# Patient Record
Sex: Male | Born: 1975 | Race: White | Hispanic: No | Marital: Married | Smoking: Never smoker
Health system: Southern US, Community
[De-identification: ages and names within clinical notes are randomized; demographics above are authoritative.]

## PROBLEM LIST (undated history)

## (undated) HISTORY — PX: COLON SURGERY: SHX602

---

## 2019-12-21 ENCOUNTER — Other Ambulatory Visit: Payer: Self-pay

## 2019-12-21 ENCOUNTER — Encounter: Payer: Self-pay | Admitting: Emergency Medicine

## 2019-12-21 ENCOUNTER — Emergency Department: Payer: BC Managed Care – PPO

## 2019-12-21 ENCOUNTER — Emergency Department
Admission: EM | Admit: 2019-12-21 | Discharge: 2019-12-21 | Disposition: A | Discharge status: Home / Self Care | Payer: BC Managed Care – PPO | Attending: Emergency Medicine | Admitting: Emergency Medicine

## 2019-12-21 DIAGNOSIS — M542 Cervicalgia: Secondary | ICD-10-CM | POA: Diagnosis present

## 2019-12-21 LAB — CBC
HCT: 44.7 % (ref 39.0–52.0)
Hemoglobin: 15.3 g/dL (ref 13.0–17.0)
MCH: 31.7 pg (ref 26.0–34.0)
MCHC: 34.2 g/dL (ref 30.0–36.0)
MCV: 92.5 fL (ref 80.0–100.0)
Platelets: 205 10*3/uL (ref 150–400)
RBC: 4.83 MIL/uL (ref 4.22–5.81)
RDW: 12.5 % (ref 11.5–15.5)
WBC: 7.5 10*3/uL (ref 4.0–10.5)
nRBC: 0 % (ref 0.0–0.2)

## 2019-12-21 LAB — BASIC METABOLIC PANEL
Anion gap: 6 (ref 5–15)
BUN: 16 mg/dL (ref 6–20)
CO2: 28 mmol/L (ref 22–32)
Calcium: 9.3 mg/dL (ref 8.9–10.3)
Chloride: 105 mmol/L (ref 98–111)
Creatinine, Ser: 0.93 mg/dL (ref 0.61–1.24)
GFR calc Af Amer: >60 mL/min (ref >60)
GFR calc non Af Amer: >60 mL/min (ref >60)
Glucose, Bld: 95 mg/dL (ref 70–99)
Potassium: 4.3 mmol/L (ref 3.5–5.1)
Sodium: 139 mmol/L (ref 135–145)

## 2019-12-21 LAB — TSH: TSH: 1.453 u[IU]/mL (ref 0.350–4.500)

## 2019-12-21 LAB — T4, FREE: Free T4: 0.7 ng/dL (ref 0.61–1.12)

## 2019-12-21 LAB — TROPONIN I (HIGH SENSITIVITY): Troponin I (High Sensitivity): 4 ng/L (ref <18)

## 2019-12-21 IMAGING — CT CT ANGIO NECK
1 of 7 series · 7 of 33 positions shown · IV contrast (APPLIED)
Comparison: None.

CLINICAL DATA: Sudden onset left-sided neck pain.

EXAM:
CT ANGIOGRAPHY NECK
TECHNIQUE: Multidetector CT imaging of the neck was performed using the
standard protocol during bolus administration of intravenous
contrast. Multiplanar CT image reconstructions and MIPs were
obtained to evaluate the vascular anatomy. Carotid stenosis
measurements (when applicable) are obtained utilizing NASCET
criteria, using the distal internal carotid diameter as the
denominator.
CONTRAST:  75mL OMNIPAQUE IOHEXOL 350 MG/ML SOLN

[Series 11: ax thin · axial · 0.41mm/px · z∈[-371,-138]mm · 7 of 327 slices shown]
[im 41/327  soft-tissue]
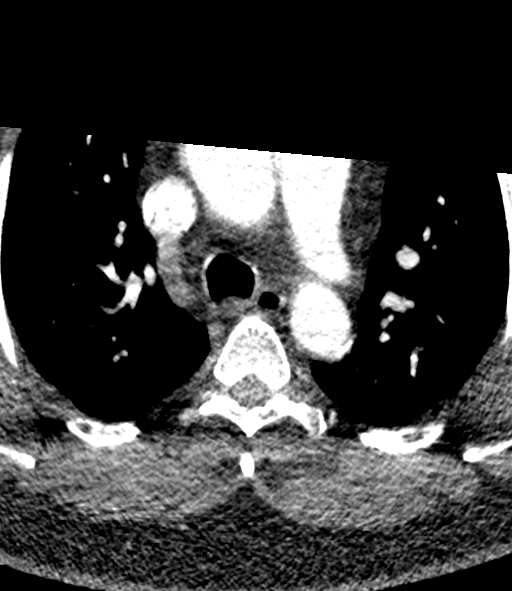
[im 82/327  bone]
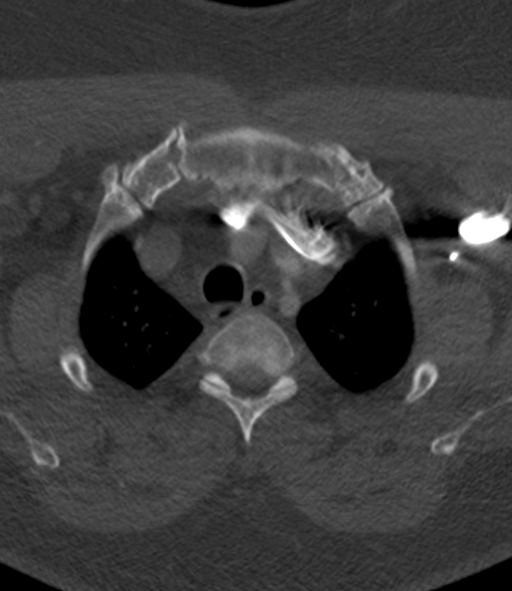
[im 123/327  soft-tissue]
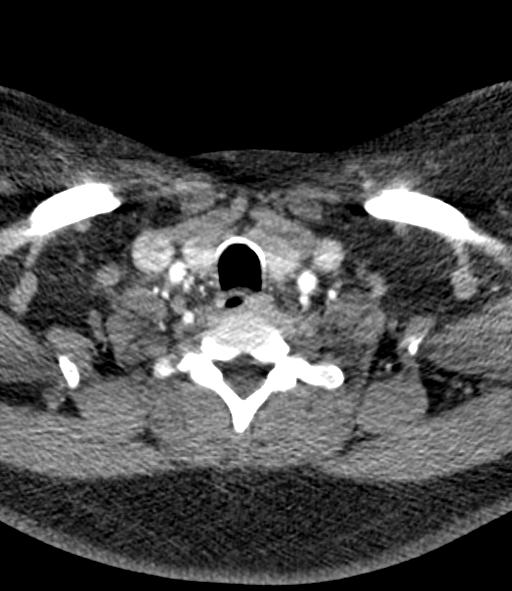
[im 164/327  bone]
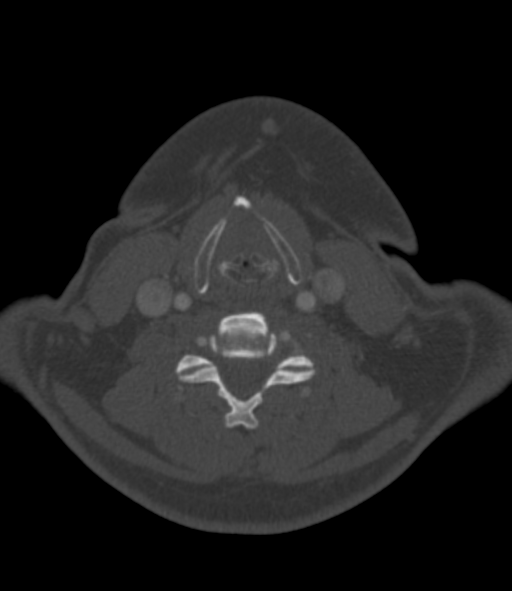
[im 204/327  soft-tissue]
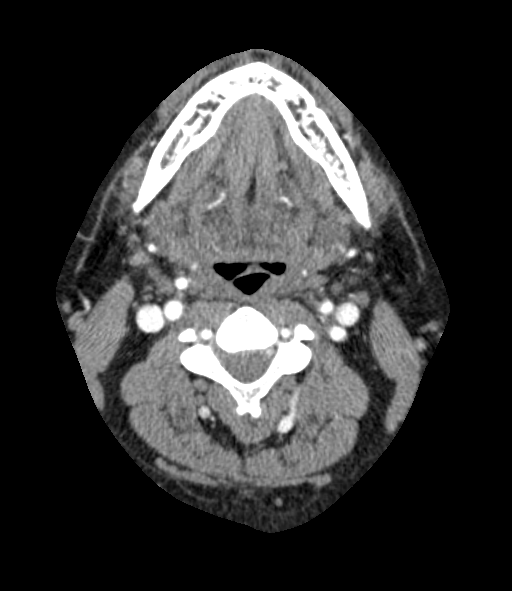
[im 245/327  bone]
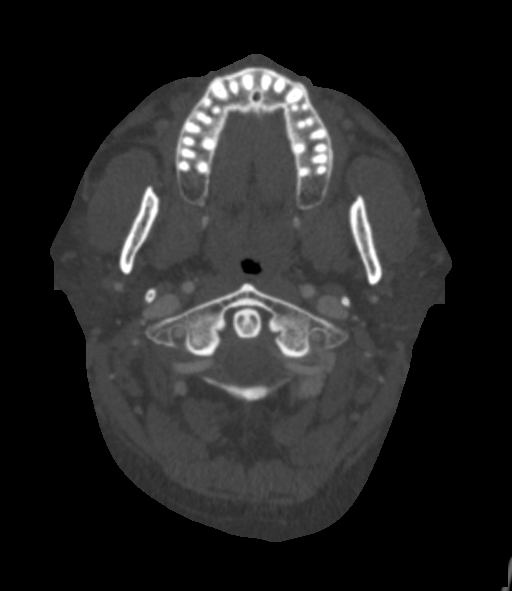
[im 286/327  soft-tissue]
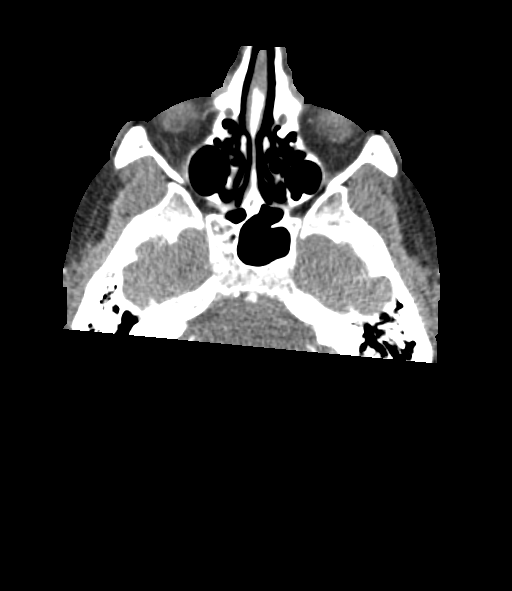

[7 of 33 positions shown; findings below may reference images not displayed]

FINDINGS: Aortic arch: Normal

Right carotid system: Normal except for a punctate focus of
calcified plaque in the ICA bulb. No stenosis. No sign of
dissection.

Left carotid system: Normal. No evidence of atherosclerotic disease
or dissection. No evidence of arteritis.

Vertebral arteries: Normal. Evidence of atherosclerotic disease or
dissection. Vertebral arteries approximately equal in size. Left
arises directly from the arch.

Skeleton: No significant cervical spondylosis or any facet
arthropathy.

Other neck: No evidence other soft tissue lesion in the neck.

Upper chest: Normal
IMPRESSION: Negative examination. No abnormality seen to explain left-sided
pain. No evidence of vascular dissection or arteritis. No sign of
significant spinal disease or other soft tissue lesion neck or upper
chest.

## 2019-12-21 MED ORDER — KETOROLAC TROMETHAMINE 30 MG/ML IJ SOLN
30.0000 mg | Freq: Once | INTRAMUSCULAR | Status: AC
  Administered 2019-12-21: 30 mg via INTRAVENOUS
  Filled 2019-12-21: qty 1

## 2019-12-21 MED ORDER — IOHEXOL 350 MG/ML SOLN
75.0000 mL | Freq: Once | INTRAVENOUS | Status: AC | PRN
  Administered 2019-12-21: 75 mL via INTRAVENOUS
  Filled 2019-12-21: qty 75

## 2019-12-21 NOTE — ED Provider Notes (Signed)
[facility] EMERGENCY DEPARTMENT Provider Note   CSN: [id number] Arrival date & time: 12/21/19  1800     History Chief Complaint  Patient presents with  . Jaw Pain  . Neck Pain    Harold Alvarado is a 44 y.o. male presents to the emergency department for evaluation of left-sided neck pain.  He points to the left anterior lateral aspect of the neck along the carotid.  States he had sudden onset of sharp pain 2 days ago with no preceding trauma or injury.  Has had persistent pain since that is worse with movement and touch.  He denies any fevers, noticeable swelling or lymphadenopathy, muscle tightness, numbness, tingling, headache, rashes, facial pain or vision changes.   HPI     History reviewed. No pertinent past medical history.  There are no problems to display for this patient.   Past Surgical History:  Procedure Laterality Date  . COLON SURGERY         No family history on file.  Social History   Tobacco Use  . Smoking status: Never Smoker  . Smokeless tobacco: Never Used  Substance Use Topics  . Alcohol use: Yes  . Drug use: Not Currently    Home Medications Prior to Admission medications   Not on File    Allergies    Morphine  Review of Systems   Review of Systems  Constitutional: Negative for chills, fatigue and fever.  HENT: Negative for facial swelling, sore throat and trouble swallowing.   Respiratory: Negative for cough and shortness of breath.   Cardiovascular: Negative for chest pain.  Gastrointestinal: Negative for nausea and vomiting.  Musculoskeletal: Positive for neck pain. Negative for joint swelling and myalgias.  Skin: Negative for rash and wound.  Neurological: Negative for dizziness, facial asymmetry, numbness and headaches.  Hematological: Negative for adenopathy.    Physical Exam Updated Vital Signs BP (!) 143/82 (BP Location: Left Arm)   Pulse 92   Temp 98.2 F (36.8 C) (Oral)   Resp 16   Ht 5\' 9"   (1.753 m)   Wt 108.9 kg   SpO2 98%   BMI 35.44 kg/m   Physical Exam Constitutional:      Appearance: Normal appearance.  HENT:     Head: Normocephalic and atraumatic.     Left Ear: Tympanic membrane, ear canal and external ear normal. There is no impacted cerumen.     Nose: Nose normal.     Mouth/Throat:     Pharynx: No oropharyngeal exudate or posterior oropharyngeal erythema.     Comments: No peritonsillar swelling, abscess formation.  Uvula is midline. Eyes:     Extraocular Movements: Extraocular movements intact.     Conjunctiva/sclera: Conjunctivae normal.  Neck:     Vascular: No carotid bruit.  Cardiovascular:     Rate and Rhythm: Normal rate.  Pulmonary:     Effort: Pulmonary effort is normal.     Breath sounds: Normal breath sounds.  Musculoskeletal:        General: Normal range of motion.     Cervical back: Normal range of motion. Tenderness present. No rigidity.  Skin:    General: Skin is warm.     Findings: No rash.  Neurological:     Mental Status: He is alert.     ED Results / Procedures / Treatments   Labs (all labs ordered are listed, but only abnormal results are displayed) Labs Reviewed  CBC  BASIC METABOLIC PANEL    EKG None  Radiology No results found.  Procedures Procedures (including critical care time)  Medications Ordered in ED Medications - No data to display  ED Course  I have reviewed the triage vital signs and the nursing notes.  Pertinent labs & imaging results that were available during my care of the patient were reviewed by me and considered in my medical decision making (see chart for details).    MDM Rules/Calculators/A&P                      44 year old male with sudden onset of left-sided neck pain 2 days ago.  No trauma or injury.  Focal tenderness along the left anterior lateral neck with no palpable mass or soft tissue swelling.  He appears well, vital signs are stable.  No neurological deficits.  Has some pain  with neck range of motion and is quite tender to palpation along the focal area of pain.  Carotid artery pulses present with no audible bruits on exam.  CT angio of the neck ordered to evaluate for any possible dissection.  Patient currently getting labs ordered with IV placement.  CT angio ordered and pending.  At this time care transferred over to [facility].   Final Clinical Impression(s) / ED Diagnoses Final diagnoses:  Left-sided neck pain    Rx / DC Orders ED Discharge Orders    None       Ronnette Juniper 12/21/19 2048    Sharman Cheek, MD 12/22/19 1606

## 2019-12-21 NOTE — ED Notes (Signed)
Esign pad non-functional

## 2019-12-21 NOTE — ED Triage Notes (Signed)
Pt to ED via POV c/o jaw and neck pain. Pt states that symptoms started 2 days ago but have gotten worse. Pt denies chest or arm pain. Pt denies fevers or chills. Pt is currently in NAD.

## 2019-12-21 NOTE — ED Provider Notes (Signed)
  Physical Exam  BP (!) 142/88 (BP Location: Right Arm)   Pulse 72   Temp 98 F (36.7 C) (Oral)   Resp 18   Ht 5\' 9"  (1.753 m)   Wt 108.9 kg   SpO2 100%   BMI 35.44 kg/m   Physical Exam  ED Course/Procedures     Procedures  MDM   Assumed care for , PA-C.  CT angio returned within the parameters of normal.  I called radiologist on-call, Dr. Cranston Neighbor and no soft tissue abnormalities were identified on CTA.  Dr. Karin Golden did not see any adenopathy at the neck that  would account for patient's symptoms.  Added on a troponin and consulted with attending, Dr. Karin Golden regarding patient's case.  Troponin returned within the parameters of normal.  Dr. Larinda Buttery personally evaluated patient.  Patient has exceptionally healthy dentition and has no pain to palpation underneath the tongue.  He is not currently having chest pain, chest tightness or shortness of breath.  Our suspicion for Ludwig's or Lemierre's is low at this time.  Patient was given IV Toradol prior to discharge.  Thyroid labs are pending at this time.  Strict return precautions were given to return to the emergency department with new or worsening symptoms.  All patient questions were answered.       Larinda Buttery [place], [place] 12/21/19 2311    2312, MD 12/22/19 [phone]

## 2019-12-23 LAB — T3, FREE: T3, Free: 3 pg/mL (ref 2.0–4.4)
